# Patient Record
Sex: Male | Born: 1985 | Race: White | Hispanic: No | Marital: Single | Smoking: Never smoker
Health system: Southern US, Community
[De-identification: ages and names within clinical notes are randomized; demographics above are authoritative.]

## PROBLEM LIST (undated history)

## (undated) HISTORY — PX: APPENDECTOMY: SHX54

---
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
## 2015-12-25 ENCOUNTER — Ambulatory Visit (INDEPENDENT_AMBULATORY_CARE_PROVIDER_SITE_OTHER): Payer: BLUE CROSS/BLUE SHIELD

## 2015-12-25 ENCOUNTER — Ambulatory Visit (INDEPENDENT_AMBULATORY_CARE_PROVIDER_SITE_OTHER): Payer: BLUE CROSS/BLUE SHIELD | Admitting: Physician Assistant

## 2015-12-25 VITALS — BP 120/76 | HR 81 | Temp 99.0°F | Resp 14 | Ht 68.25 in | Wt 198.0 lb

## 2015-12-25 DIAGNOSIS — R1031 Right lower quadrant pain: Secondary | ICD-10-CM

## 2015-12-25 NOTE — Progress Notes (Signed)
   Subjective:    Patient ID: Carlos Lam, male    DOB: 18-May-1986, 30 y.o.   MRN: [id number]  HPI  Nicolas presents today for RLQ pain that began 4 days ago and worsened yesterday. He was concerned that he might have a hernia. Last Wednesday he was helping to move a basketball goal and this involved some heavy lifting. PSH is significant for appendectomy. His pain is mostly dull and a 1/10 when he is still, with movement the pain is sharper and can be up to 6/10. The pain is constant but does not radiate. It is worsened with pressure and eating. Nothing he's tried has relieved the pain. He denies any constipation, diarrhea, n/v. He does say that his bowel movements have been smaller, but attributes this to the fact that he hasn't been eating as much since it causes pain. Also denies fever, chills.    Review of Systems  Constitutional: Negative for appetite change.  Respiratory: Negative for shortness of breath.   Cardiovascular: Negative for chest pain.  Genitourinary: Negative for difficulty urinating.   No current outpatient prescriptions on file.   No Known Allergies    Objective:   Physical Exam  Constitutional: He is oriented to person, place, and time. He appears well-developed and well-nourished. He is cooperative.  Non-toxic appearance. No distress.  Blood pressure 120/76, pulse 81, temperature 99 F (37.2 C), temperature source Oral, resp. rate 14, height 5' 8.25" (1.734 m), weight 198 lb (89.8 kg), SpO2 96 %.  HENT:  Head: Normocephalic and atraumatic.  Neck: Trachea normal and normal range of motion. Neck supple.  Cardiovascular: Normal rate, regular rhythm, normal heart sounds and intact distal pulses.   Pulmonary/Chest: Effort normal and breath sounds normal.  Abdominal: Soft. Normal appearance and bowel sounds are normal. He exhibits no distension. There is tenderness in the right lower quadrant and periumbilical area. There is no rigidity, no rebound and no guarding.  No hernia (No hernia appreciated on exam).    Musculoskeletal: Normal range of motion.  Lymphadenopathy:    He has no cervical adenopathy.  Neurological: He is alert and oriented to person, place, and time.  Skin: Skin is warm, dry and intact. No ecchymosis noted. No erythema.  Psychiatric: He has a normal mood and affect. His speech is normal and behavior is normal.    Imaging Results DG ABDOMEN ACUTE W/ 1V CHEST  COMPARISON:  None.  FINDINGS: No active infiltrate or effusion is seen. There is some peribronchial thickening which can be seen with bronchitis. Mediastinal and hilar contours are unremarkable. The heart is within normal limits in size.  Supine and erect views of the abdomen show no bowel obstruction. No free air is seen on the erect view. No opaque calculi are noted. A surgical clip is present in the right lower quadrant presumably due to prior appendectomy.  IMPRESSION: 1. No active lung disease.  Question bronchitis. 2. No bowel obstruction.  No free air.   Electronically Signed   By: Dwyane Dee M.D.   On: 12/25/2015 13:10    Assessment & Plan:  1. RLQ abdominal pain - DG Abd Acute W/Chest; Future - suspect that pain may likely be due to abdominal wall muscle strain/tear - recommended rest, warm compresses, OTC ibuprofen or advil for pain - f/u if pain not improved or worsened in 48-72 hours, or if patient experiences nausea/vomiting, fever or chills.   Kelly Rayburn PA-S 12/25/15

## 2015-12-25 NOTE — Patient Instructions (Addendum)
It is not clear what is causing your pain. I suspect either a strain of the muscle in the abdominal wall or possibly a hernia related to the surgery to remove your appendix. I recommend rest, warm compresses (15-20 minutes 2-4 times/day) and an anti-inflammatory. If the pain persists, or if it worsens, or if you develop fever, chills, nausea/vomiting, please return for re-evaluation.    IF you received an x-ray today, you will receive an invoice from [facility]. Please contact [facility] at [phone] with questions or concerns regarding your invoice.   IF you received labwork today, you will receive an invoice from [employer]. Please contact [facility] at [phone] with questions or concerns regarding your invoice.   Our billing staff will not be able to assist you with questions regarding bills from these companies.  You will be contacted with the lab results as soon as they are available. The fastest way to get your results is to activate your My Chart account. Instructions are located on the last page of this paperwork. If you have not heard from [place] regarding the results in 2 weeks, please contact this office.

## 2015-12-25 NOTE — Progress Notes (Signed)
Patient ID: Carlos Lam, male     DOB: 01-13-1986, 30 y.o.    MRN: [id number]  PCP: No PCP Per Patient  Chief Complaint  Patient presents with  . Abdominal Pain    right side x 4 days    Subjective:    HPI  Presents for evaluation of RLQ abdominal pain.  The pain began 4 days ago, following several days of heavy lifting (he was helping friends and family move furniture, then moved a basketball goal alone). The pain worsened yesterday, and he is concerned that he may have a hernia.  Describes the pain as a dull ache. At rest 1/10, with movement 6/10. Constant and without radiation. Pushing at the spot and eating increase the pain, and he is more comfortable sitting up than lying down. He has taken nothing to try to alleviate the pain. No fever, chills, nausea, vomiting, diarrhea or constipation. His BMs have been smaller since reducing his oral intake.  S/P appendectomy.   Prior to Admission medications   Not on File     No Known Allergies   There are no active problems to display for this patient.    History reviewed. No pertinent family history.   Social History   Social History  . Marital status: Single    Spouse name: N/A  . Number of children: N/A  . Years of education: N/A   Occupational History  . Not on file.   Social History Main Topics  . Smoking status: Never Smoker  . Smokeless tobacco: Never Used  . Alcohol use 1.2 - 2.4 oz/week    2 - 4 Cans of beer per week  . Drug use: No  . Sexual activity: Not Currently    Birth control/ protection: Condom   Other Topics Concern  . Not on file   Social History Narrative  . No narrative on file        Review of Systems       Objective:  Physical Exam  Constitutional: He is oriented to person, place, and time. He appears well-developed and well-nourished. He is active and cooperative. No distress.  BP 120/76 (BP Location: Right Arm, Patient Position: Sitting, Cuff Size: Large)    Pulse 81   Temp 99 F (37.2 C) (Oral)   Resp 14   Ht 5' 8.25" (1.734 m)   Wt 198 lb (89.8 kg)   SpO2 96%   BMI 29.89 kg/m   HENT:  Head: Normocephalic and atraumatic.  Right Ear: Hearing normal.  Left Ear: Hearing normal.  Eyes: Conjunctivae are normal. No scleral icterus.  Neck: Normal range of motion. Neck supple. No thyromegaly present.  Cardiovascular: Normal rate, regular rhythm and normal heart sounds.   Pulses:      Radial pulses are 2+ on the right side, and 2+ on the left side.  Pulmonary/Chest: Effort normal and breath sounds normal.  Abdominal: Soft. Normal appearance and bowel sounds are normal. He exhibits no distension and no mass. There is no hepatosplenomegaly. There is tenderness in the right lower quadrant and periumbilical area. There is no rigidity, no rebound, no guarding, no CVA tenderness and no tenderness at McBurney's point. No hernia.    Lymphadenopathy:       Head (right side): No tonsillar, no preauricular, no posterior auricular and no occipital adenopathy present.       Head (left side): No tonsillar, no preauricular, no posterior auricular and no occipital adenopathy present.    He has no  cervical adenopathy.       Right: No supraclavicular adenopathy present.       Left: No supraclavicular adenopathy present.  Neurological: He is alert and oriented to person, place, and time. No sensory deficit.  Skin: Skin is warm, dry and intact. No rash noted. No cyanosis or erythema. Nails show no clubbing.  Psychiatric: He has a normal mood and affect. His speech is normal and behavior is normal.        Dg Abd Acute W/chest  Result Date: 12/25/2015 CLINICAL DATA:  Right lower quadrant abdominal pain for 4 days, history of prior appendectomy EXAM: DG ABDOMEN ACUTE W/ 1V CHEST COMPARISON:  None. FINDINGS: No active infiltrate or effusion is seen. There is some peribronchial thickening which can be seen with bronchitis. Mediastinal and hilar contours are  unremarkable. The heart is within normal limits in size. Supine and erect views of the abdomen show no bowel obstruction. No free air is seen on the erect view. No opaque calculi are noted. A surgical clip is present in the right lower quadrant presumably due to prior appendectomy. IMPRESSION: 1. No active lung disease.  Question bronchitis. 2. No bowel obstruction.  No free air. Electronically Signed   By: Dwyane Dee M.D.   On: 12/25/2015 13:10        Assessment & Plan:  1. RLQ abdominal pain S/p appendectomy. Normal abdominal series radiographs, bronchitis is unlikely, as he has no respiratory symptoms. Suspect abdominal wall muscle strain, possibly tear, though hernia cannot be entirely excluded. Supportive care with rest, hydration and NSAIDS. If the pain persists or worsens, or if he develops any new symptoms, would proceed with CT to evaluate for possible hernia. - DG Abd Acute W/Chest; Future   Fernande Bras, PA-C Physician Assistant-Certified [facility]

## 2017-11-15 IMAGING — DX DG ABDOMEN ACUTE W/ 1V CHEST
4 series · 4 of 4 positions shown · non-contrast
Comparison: None.

CLINICAL DATA: Right lower quadrant abdominal pain for 4 days,
history of prior appendectomy

EXAM:
DG ABDOMEN ACUTE W/ 1V CHEST

[chest pa]
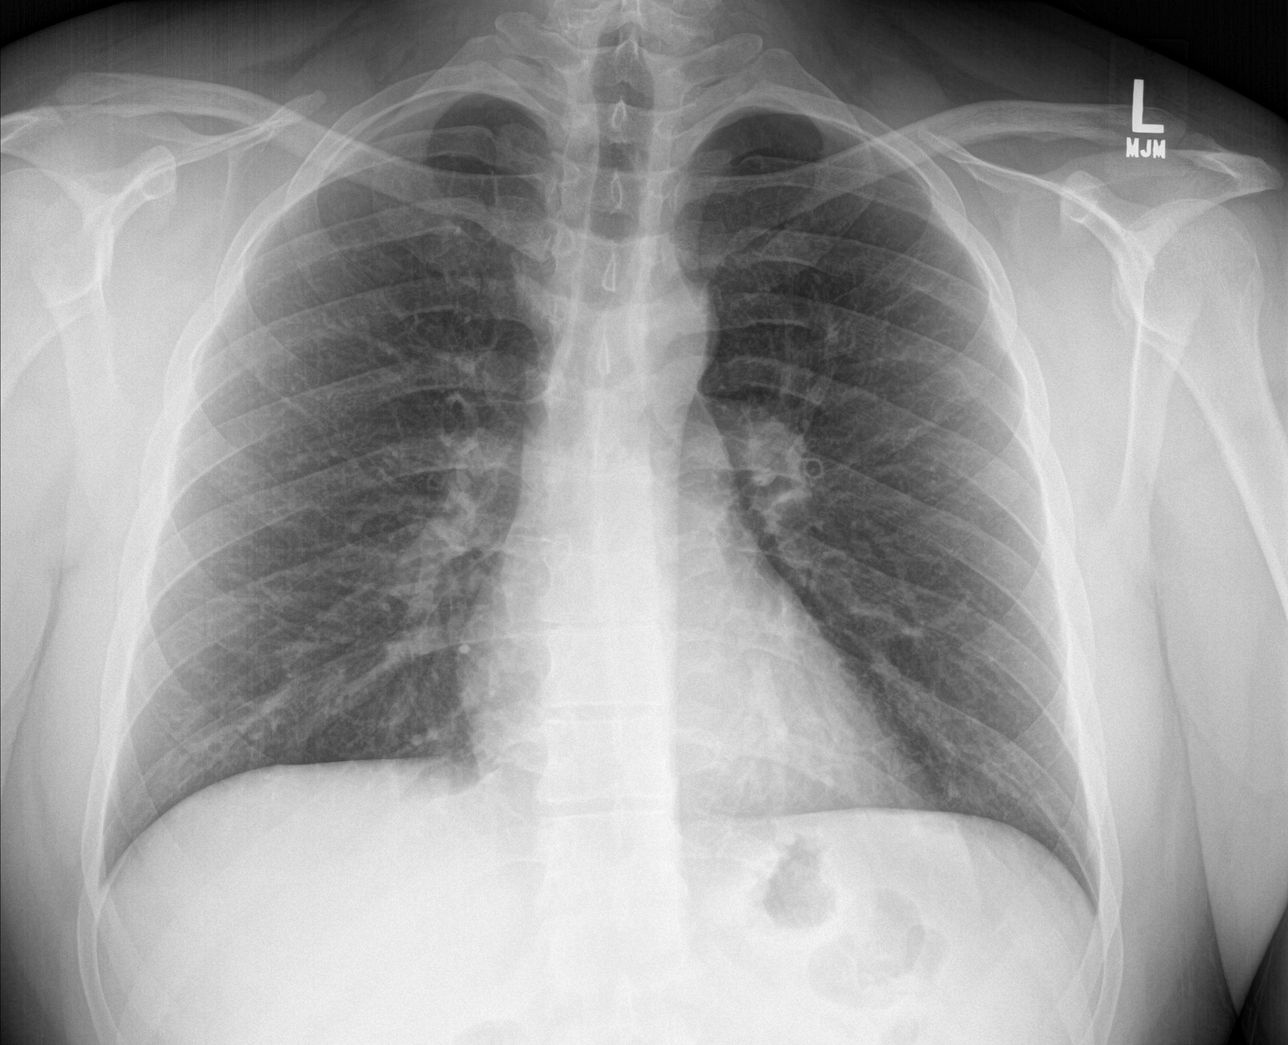

[abdomen erect]
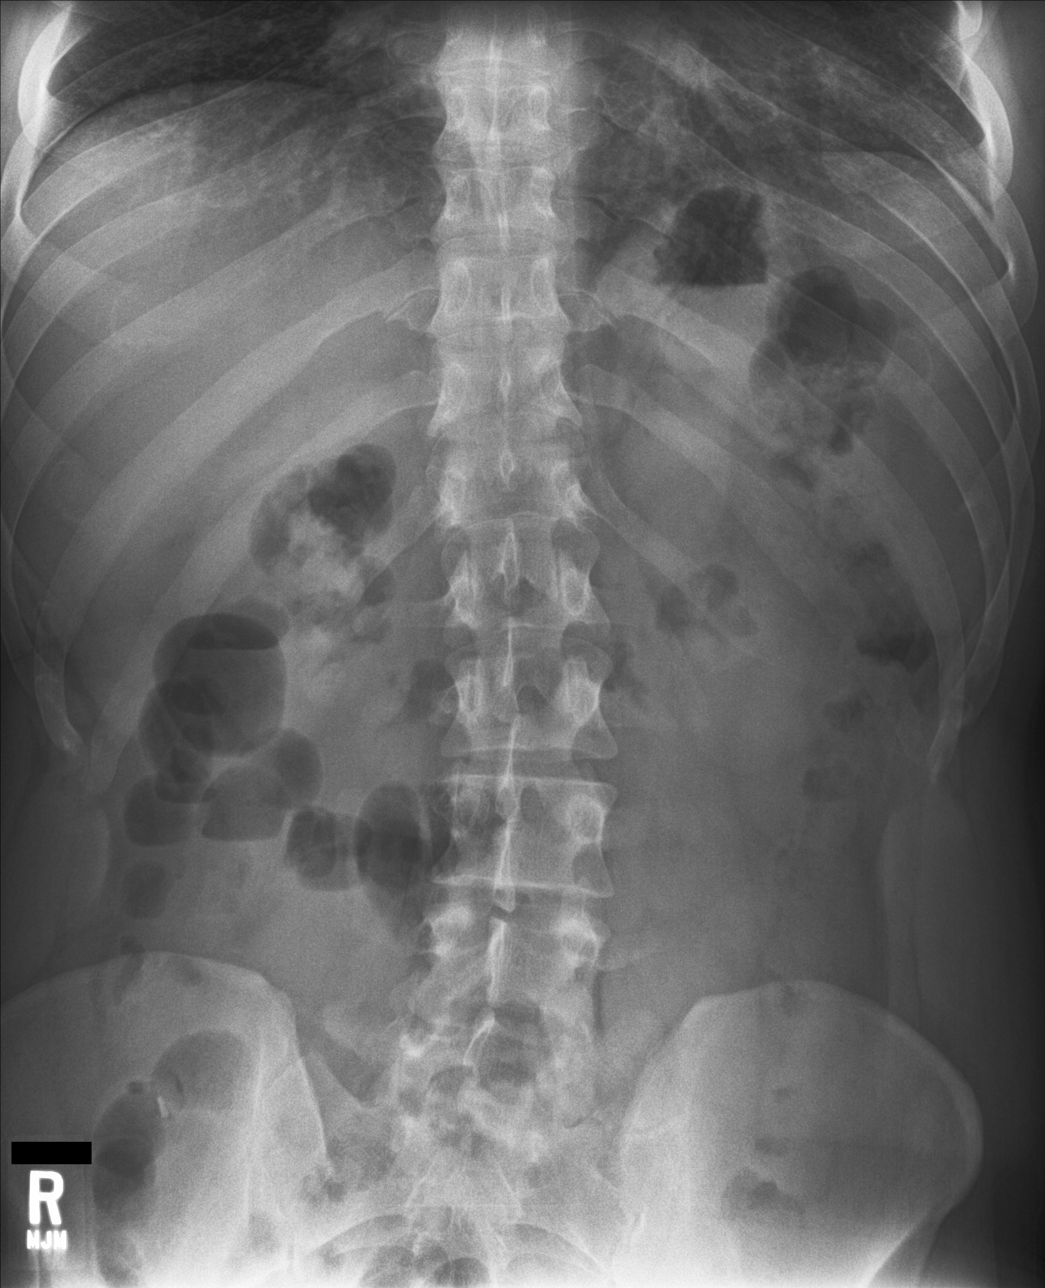

[abdomen supine (1 of 2)]
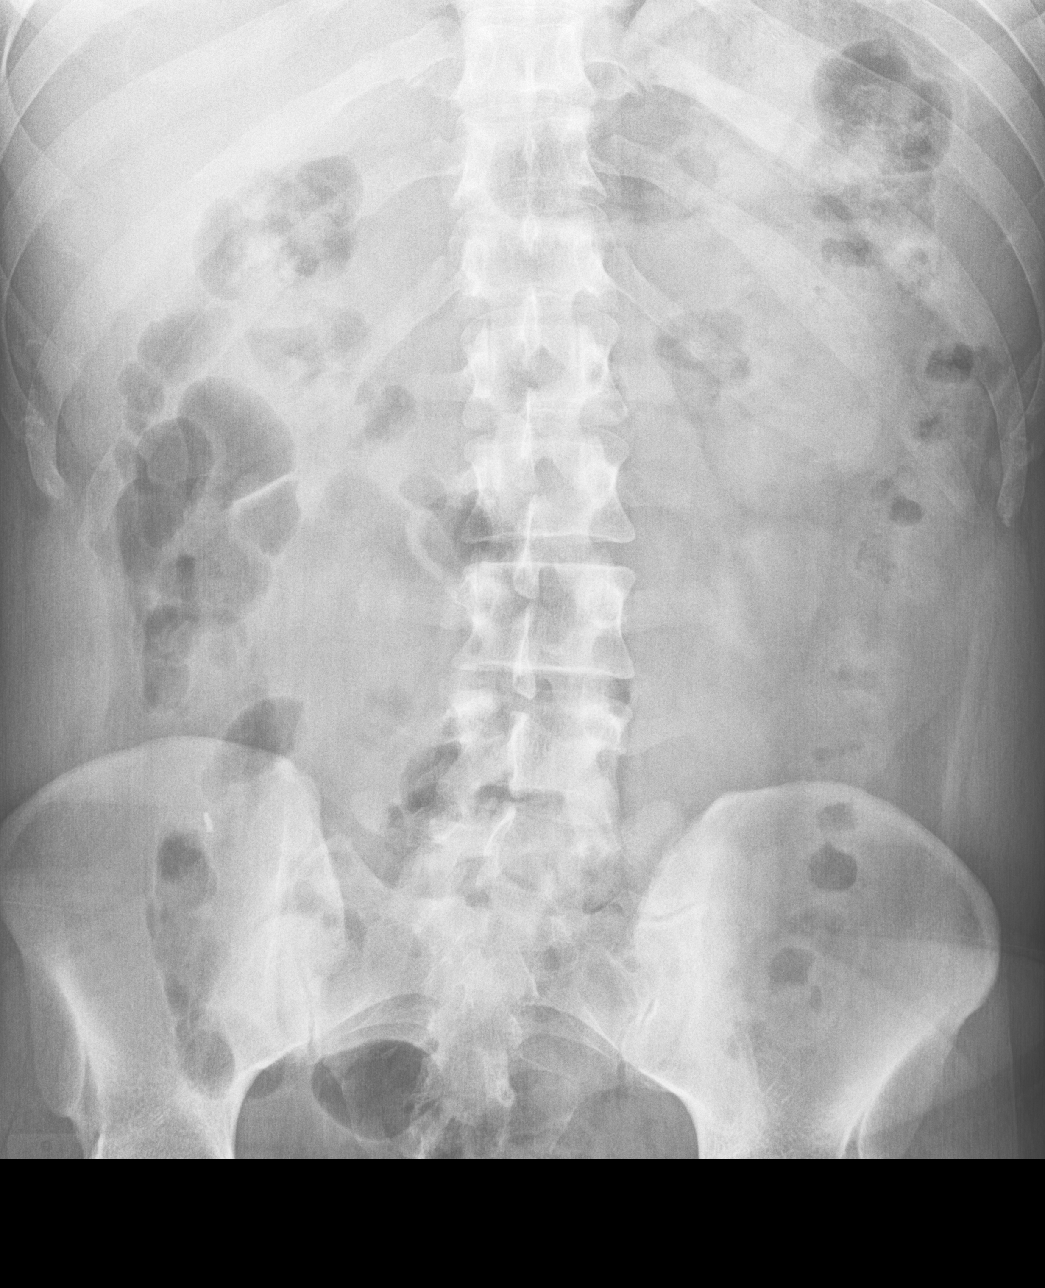

[abdomen supine (2 of 2)]
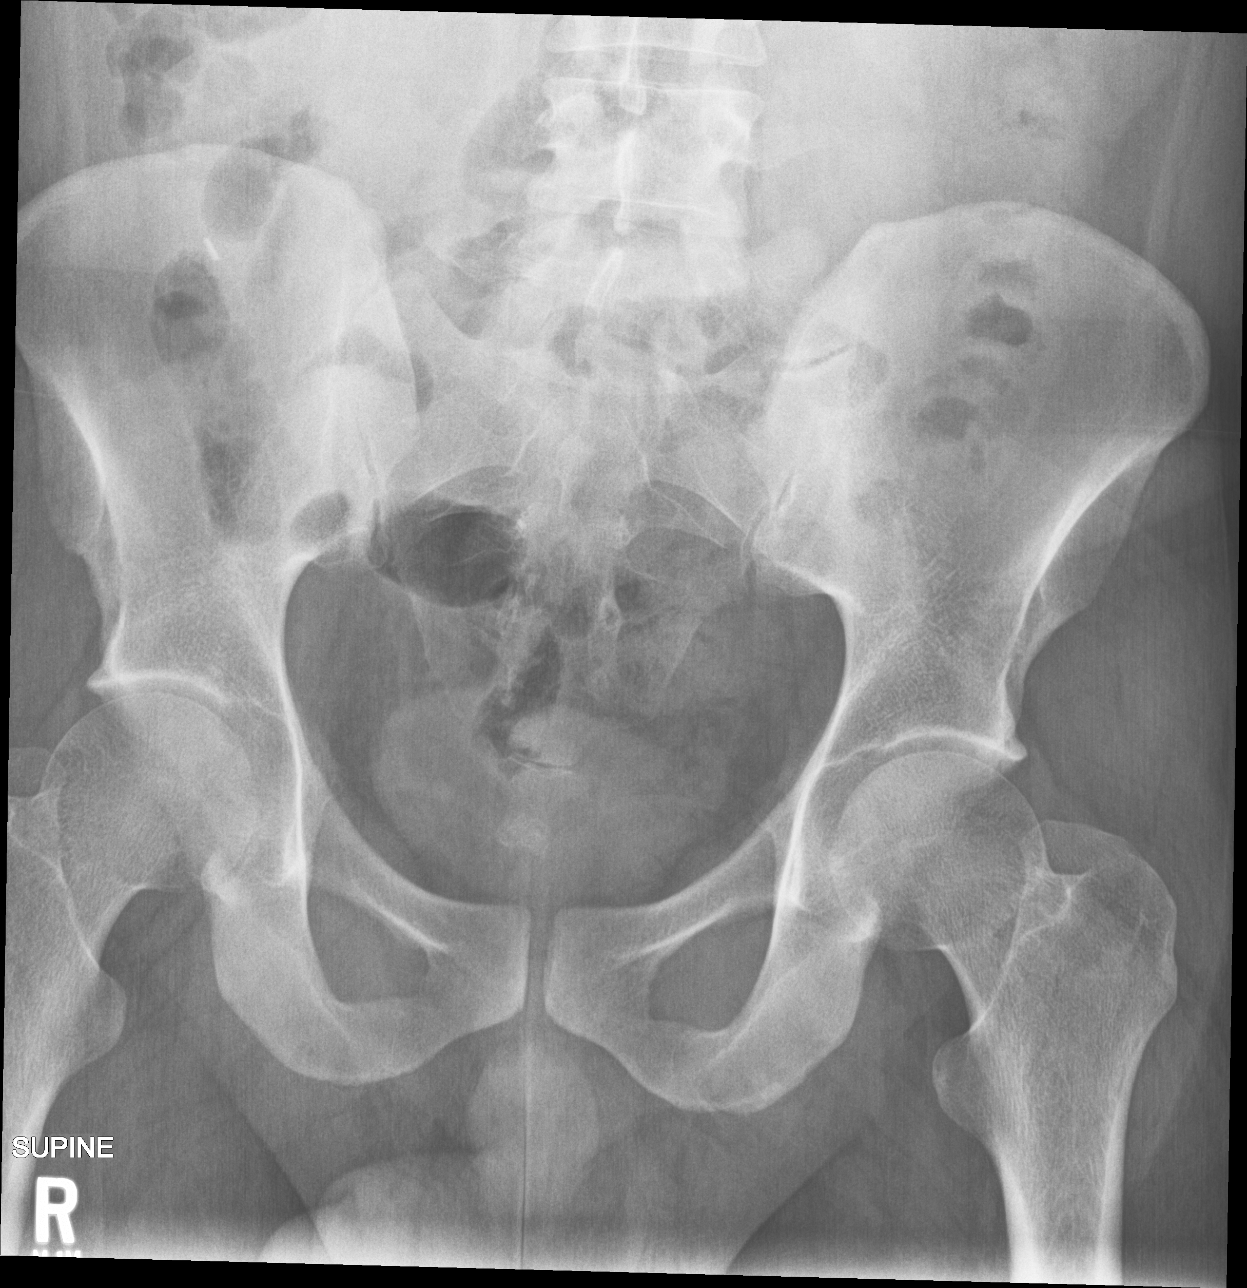

[4 of 4 positions shown; findings below may reference images not displayed]

FINDINGS: No active infiltrate or effusion is seen. There is some
peribronchial thickening which can be seen with bronchitis.
Mediastinal and hilar contours are unremarkable. The heart is within
normal limits in size.

Supine and erect views of the abdomen show no bowel obstruction. No
free air is seen on the erect view. No opaque calculi are noted. A
surgical clip is present in the right lower quadrant presumably due
to prior appendectomy.
IMPRESSION: 1. No active lung disease.  Question bronchitis.
2. No bowel obstruction.  No free air.
